# Patient Record
Sex: Female | Born: 1990 | Race: Black or African American | Hispanic: No | Marital: Single
Health system: Southern US, Community
[De-identification: ages and names within clinical notes are randomized; demographics above are authoritative.]

---

## 2014-05-18 ENCOUNTER — Encounter (HOSPITAL_COMMUNITY): Payer: Self-pay | Admitting: *Deleted

## 2014-05-18 ENCOUNTER — Emergency Department (HOSPITAL_COMMUNITY): Payer: Medicaid Other

## 2014-05-18 ENCOUNTER — Emergency Department (HOSPITAL_COMMUNITY)
Admission: EM | Admit: 2014-05-18 | Discharge: 2014-05-18 | Disposition: A | Discharge status: Home / Self Care | Payer: Medicaid Other | Attending: Emergency Medicine | Admitting: Emergency Medicine

## 2014-05-18 DIAGNOSIS — R58 Hemorrhage, not elsewhere classified: Secondary | ICD-10-CM

## 2014-05-18 DIAGNOSIS — N390 Urinary tract infection, site not specified: Secondary | ICD-10-CM

## 2014-05-18 DIAGNOSIS — Z3A18 18 weeks gestation of pregnancy: Secondary | ICD-10-CM | POA: Diagnosis not present

## 2014-05-18 DIAGNOSIS — O2342 Unspecified infection of urinary tract in pregnancy, second trimester: Secondary | ICD-10-CM | POA: Insufficient documentation

## 2014-05-18 DIAGNOSIS — O200 Threatened abortion: Secondary | ICD-10-CM | POA: Diagnosis not present

## 2014-05-18 DIAGNOSIS — O209 Hemorrhage in early pregnancy, unspecified: Secondary | ICD-10-CM | POA: Diagnosis present

## 2014-05-18 LAB — CBC WITH DIFFERENTIAL/PLATELET
BASOS PCT: 0 % (ref 0–1)
Basophils Absolute: 0 10*3/uL (ref 0.0–0.1)
EOS ABS: 0.1 10*3/uL (ref 0.0–0.7)
Eosinophils Relative: 1 % (ref 0–5)
HCT: 28.8 % — ABNORMAL LOW (ref 36.0–46.0)
Hemoglobin: 9.9 g/dL — ABNORMAL LOW (ref 12.0–15.0)
Lymphocytes Relative: 22 % (ref 12–46)
Lymphs Abs: 2 10*3/uL (ref 0.7–4.0)
MCH: 33.1 pg (ref 26.0–34.0)
MCHC: 34.4 g/dL (ref 30.0–36.0)
MCV: 96.3 fL (ref 78.0–100.0)
Monocytes Absolute: 0.8 10*3/uL (ref 0.1–1.0)
Monocytes Relative: 8 % (ref 3–12)
NEUTROS ABS: 6.2 10*3/uL (ref 1.7–7.7)
Neutrophils Relative %: 69 % (ref 43–77)
PLATELETS: 224 10*3/uL (ref 150–400)
RBC: 2.99 MIL/uL — AB (ref 3.87–5.11)
RDW: 12.6 % (ref 11.5–15.5)
WBC: 9.1 10*3/uL (ref 4.0–10.5)

## 2014-05-18 LAB — URINALYSIS, ROUTINE W REFLEX MICROSCOPIC
BILIRUBIN URINE: NEGATIVE
GLUCOSE, UA: NEGATIVE mg/dL
HGB URINE DIPSTICK: NEGATIVE
Ketones, ur: 40 mg/dL — AB
Nitrite: POSITIVE — AB
Protein, ur: NEGATIVE mg/dL
SPECIFIC GRAVITY, URINE: 1.024 (ref 1.005–1.030)
Urobilinogen, UA: 0.2 mg/dL (ref 0.0–1.0)
pH: 6 (ref 5.0–8.0)

## 2014-05-18 LAB — COMPREHENSIVE METABOLIC PANEL
ALK PHOS: 41 U/L (ref 39–117)
ALT: 9 U/L (ref 0–35)
AST: 15 U/L (ref 0–37)
Albumin: 3.6 g/dL (ref 3.5–5.2)
Anion gap: 15 (ref 5–15)
BILIRUBIN TOTAL: 0.4 mg/dL (ref 0.3–1.2)
BUN: 9 mg/dL (ref 6–23)
CHLORIDE: 101 meq/L (ref 96–112)
CO2: 20 mEq/L (ref 19–32)
Calcium: 9.2 mg/dL (ref 8.4–10.5)
Creatinine, Ser: 0.42 mg/dL — ABNORMAL LOW (ref 0.50–1.10)
GFR calc Af Amer: >90 mL/min (ref >90)
GFR calc non Af Amer: >90 mL/min (ref >90)
Glucose, Bld: 73 mg/dL (ref 70–99)
POTASSIUM: 3.7 meq/L (ref 3.7–5.3)
SODIUM: 136 meq/L — AB (ref 137–147)
TOTAL PROTEIN: 7 g/dL (ref 6.0–8.3)

## 2014-05-18 LAB — WET PREP, GENITAL
Trich, Wet Prep: NONE SEEN
Yeast Wet Prep HPF POC: NONE SEEN

## 2014-05-18 LAB — URINE MICROSCOPIC-ADD ON

## 2014-05-18 LAB — HIV ANTIBODY (ROUTINE TESTING W REFLEX): HIV: NONREACTIVE

## 2014-05-18 LAB — POC URINE PREG, ED: Preg Test, Ur: POSITIVE — AB

## 2014-05-18 LAB — LIPASE, BLOOD: Lipase: 17 U/L (ref 11–59)

## 2014-05-18 LAB — ABO/RH: ABO/RH(D): O POS

## 2014-05-18 LAB — HCG, QUANTITATIVE, PREGNANCY: HCG, BETA CHAIN, QUANT, S: 21550 m[IU]/mL — AB (ref <5)

## 2014-05-18 MED ORDER — CEPHALEXIN 500 MG PO CAPS: 500.0000 mg | ORAL_CAPSULE | Freq: Four times a day (QID) | ORAL | Status: DC

## 2014-05-18 MED ORDER — PRENATAL COMPLETE 14-0.4 MG PO TABS: 1.0000 | ORAL_TABLET | Freq: Two times a day (BID) | ORAL | Status: AC

## 2014-05-18 MED ORDER — NITROFURANTOIN MONOHYD MACRO 100 MG PO CAPS: 100.0000 mg | ORAL_CAPSULE | Freq: Two times a day (BID) | ORAL | Status: AC

## 2014-05-18 MED ORDER — PRENATAL COMPLETE 14-0.4 MG PO TABS: 1.0000 | ORAL_TABLET | Freq: Two times a day (BID) | ORAL | Status: DC

## 2014-05-18 NOTE — ED Provider Notes (Signed)
Patient handed off from LakewoodAbigail Harris, PA-C  "Margaret Prince is a(n) 23 y.o. G1P0 female who presents to the ED for complaint of vaginal bleeding. Patient's LMP was 01/11/2014. She is here living with her Grandmother. The patient has had intermittent nausea and vomiting through her pregnancy. One week ago she had an episode of acute onset nausea and vomiting. Patient states that she also had some diarrhea when she went to wipe, she noticed some spotting from her vagina. Patient thought that she had a miscarriage. She states that she still however is having "symptoms of pregnancy." Which include enlarged. Abdomen, heavy breasts, nausea. Patient denies pain today or bleeding. She states she "wants to make sure that she states still pregnant and that everything seemed okay. Her grandmother. Attends her today. The patient denies urinary symptoms." Arthor Captain_[name], PA-C  Results for orders placed or performed during the hospital encounter of 05/18/14  Wet prep, genital  Result Value Ref Range   Yeast Wet Prep HPF POC NONE SEEN NONE SEEN   Trich, Wet Prep NONE SEEN NONE SEEN   Clue Cells Wet Prep HPF POC FEW (A) NONE SEEN   WBC, Wet Prep HPF POC FEW (A) NONE SEEN  CBC with Differential  Result Value Ref Range   WBC 9.1 4.0 - 10.5 K/uL   RBC 2.99 (L) 3.87 - 5.11 MIL/uL   Hemoglobin 9.9 (L) 12.0 - 15.0 g/dL   HCT 40.928.8 (L) 81.136.0 - 91.446.0 %   MCV 96.3 78.0 - 100.0 fL   MCH 33.1 26.0 - 34.0 pg   MCHC 34.4 30.0 - 36.0 g/dL   RDW 78.212.6 95.611.5 - 21.315.5 %   Platelets 224 150 - 400 K/uL   Neutrophils Relative % 69 43 - 77 %   Neutro Abs 6.2 1.7 - 7.7 K/uL   Lymphocytes Relative 22 12 - 46 %   Lymphs Abs 2.0 0.7 - 4.0 K/uL   Monocytes Relative 8 3 - 12 %   Monocytes Absolute 0.8 0.1 - 1.0 K/uL   Eosinophils Relative 1 0 - 5 %   Eosinophils Absolute 0.1 0.0 - 0.7 K/uL   Basophils Relative 0 0 - 1 %   Basophils Absolute 0.0 0.0 - 0.1 K/uL  Comprehensive metabolic panel  Result Value Ref Range   Sodium 136  (L) 137 - 147 mEq/L   Potassium 3.7 3.7 - 5.3 mEq/L   Chloride 101 96 - 112 mEq/L   CO2 20 19 - 32 mEq/L   Glucose, Bld 73 70 - 99 mg/dL   BUN 9 6 - 23 mg/dL   Creatinine, Ser 0.860.42 (L) 0.50 - 1.10 mg/dL   Calcium 9.2 8.4 - 57.810.5 mg/dL   Total Protein 7.0 6.0 - 8.3 g/dL   Albumin 3.6 3.5 - 5.2 g/dL   AST 15 0 - 37 U/L   ALT 9 0 - 35 U/L   Alkaline Phosphatase 41 39 - 117 U/L   Total Bilirubin 0.4 0.3 - 1.2 mg/dL   GFR calc non Af Amer >90 >90 mL/min   GFR calc Af Amer >90 >90 mL/min   Anion gap 15 5 - 15  Lipase, blood  Result Value Ref Range   Lipase 17 11 - 59 U/L  Urinalysis, Routine w reflex microscopic  Result Value Ref Range   Color, Urine YELLOW YELLOW   APPearance CLOUDY (A) CLEAR   Specific Gravity, Urine 1.024 1.005 - 1.030   pH 6.0 5.0 - 8.0   Glucose, UA NEGATIVE NEGATIVE  mg/dL   Hgb urine dipstick NEGATIVE NEGATIVE   Bilirubin Urine NEGATIVE NEGATIVE   Ketones, ur 40 (A) NEGATIVE mg/dL   Protein, ur NEGATIVE NEGATIVE mg/dL   Urobilinogen, UA 0.2 0.0 - 1.0 mg/dL   Nitrite POSITIVE (A) NEGATIVE   Leukocytes, UA MODERATE (A) NEGATIVE  hCG, quantitative, pregnancy  Result Value Ref Range   hCG, Beta Chain, Quant, S 21550 (H) <5 mIU/mL  Urine microscopic-add on  Result Value Ref Range   Squamous Epithelial / LPF FEW (A) RARE   WBC, UA 11-20 <3 WBC/hpf   Bacteria, UA MANY (A) RARE  POC urine preg, ED  Result Value Ref Range   Preg Test, Ur POSITIVE (A) NEGATIVE  ABO/Rh  Result Value Ref Range   ABO/RH(D) O POS    No rh immune globuloin NOT A RH IMMUNE GLOBULIN CANDIDATE, PT RH POSITIVE    [place] Ob Limited  05/18/2014   CLINICAL DATA:  Patient with history of vaginal bleeding.  EXAM: LIMITED OBSTETRIC ULTRASOUND  FINDINGS: Number of Fetuses: 1  Heart Rate:  145 bpm  Movement: Yes  Presentation: Cephalic  Placental Location: Posterior  Previa: No  Amniotic Fluid (Subjective):  Within normal limits.  BPD:  39.9cm 18w  1d  MATERNAL FINDINGS:  Cervix:  Appears closed.   Uterus/Adnexae:  No abnormality visualized.  IMPRESSION: Single live intrauterine gestation without definite evidence for acute complication on limited examination.  This exam is performed on an emergent basis and does not comprehensively evaluate fetal size, dating, or anatomy; follow-up complete OB US should be considered if further fetal assessment is warranted.   Electronically Signed   By: Annia Beltrew  Davis M.D.   On: 05/18/2014 16:55    4: 21 pm The patient is pregnant and has had bleeding. She is Rh+ and does not need RH immune globulin. Her urine preg is positive. She does not continue to bleed or have any pain today. She is currently awaiting to go OB US. She does have a UTI and will be given Keflex PO for this.  5:22 pm BPD: 39.9cm 18w 1day Patient has had no more bleeding in the ED either.  She has been diagnosed with threatened abortion and UTI.  Rx: Macrobid and Prenatal vitamins.[facility]. Patient made aware she is anemia and recommended she go to wom[facility] outpatient clinic if she has anymore complications. Or [facility]  23 y.o.Margaret Prince's evaluation in the Emergency Department is complete. It has been determined that no acute conditions requiring further emergency intervention are present at this time. The patient/guardian have been advised of the diagnosis and plan. We have discussed signs and symptoms that warrant return to the ED, such as changes or worsening in symptoms.  Vital signs are stable at discharge. Filed Vitals:   05/18/14 1500  BP: 111/64  Pulse: 95  Temp:   Resp:     Patient/guardian has voiced understanding and agreed to follow-up with the PCP or specialist.   [name] Taniqua Issa, PA-C 05/18/14 1723  [name] [name], PA-C 05/18/14 1737  [name] [name], PA-C 05/18/14 [id number]

## 2014-05-18 NOTE — ED Notes (Signed)
Pt in c/o episode of vaginal bleeding, LMP was 8/1 and patient has had a positive pregnancy test, on 12/2 patient experienced n/v/d, and during that had some scant bleeding that was noticed when wiping after the bathroom, no bleeding since that time, denies pain. Pt had an ultrasound confirming intrauterine pregnancy in October. Pt in today to make sure everything is ok, concerned she may have had a miscarriage when she was bleeding. No distress noted. Pt G1P0

## 2014-05-18 NOTE — ED Notes (Signed)
Pt reports that she is having a miscarriage, due to having n/v everytime she eats and had vaginal bleeding on 12/2. [place] and unsure of how far along she is but stats her due date was 5/7.

## 2014-05-18 NOTE — Discharge Instructions (Signed)
Urinary Tract Infection °Urinary tract infections (UTIs) can develop anywhere along your urinary tract. Your urinary tract is your body's drainage system for removing wastes and extra water. Your urinary tract includes two kidneys, two ureters, a bladder, and a urethra. Your kidneys are a pair of bean-shaped organs. Each kidney is about the size of your fist. They are located below your ribs, one on each side of your spine. °CAUSES °Infections are caused by microbes, which are microscopic organisms, including fungi, viruses, and bacteria. These organisms are so small that they can only be seen through a microscope. Bacteria are the microbes that most commonly cause UTIs. °SYMPTOMS  °Symptoms of UTIs may vary by age and gender of the patient and by the location of the infection. Symptoms in young women typically include a frequent and intense urge to urinate and a painful, burning feeling in the bladder or urethra during urination. Older women and men are more likely to be tired, shaky, and weak and have muscle aches and abdominal pain. A fever may mean the infection is in your kidneys. Other symptoms of a kidney infection include pain in your back or sides below the ribs, nausea, and vomiting. °DIAGNOSIS °To diagnose a UTI, your caregiver will ask you about your symptoms. Your caregiver also will ask to provide a urine sample. The urine sample will be tested for bacteria and white blood cells. White blood cells are made by your body to help fight infection. °TREATMENT  °Typically, UTIs can be treated with medication. Because most UTIs are caused by a bacterial infection, they usually can be treated with the use of antibiotics. The choice of antibiotic and length of treatment depend on your symptoms and the type of bacteria causing your infection. °HOME CARE INSTRUCTIONS °· If you were prescribed antibiotics, take them exactly as your caregiver instructs you. Finish the medication even if you feel better after you  have only taken some of the medication. °· Drink enough water and fluids to keep your urine clear or pale yellow. °· Avoid caffeine, tea, and carbonated beverages. They tend to irritate your bladder. °· Empty your bladder often. Avoid holding urine for long periods of time. °· Empty your bladder before and after sexual intercourse. °· After a bowel movement, women should cleanse from front to back. Use each tissue only once. °SEEK MEDICAL CARE IF:  °· You have back pain. °· You develop a fever. °· Your symptoms do not begin to resolve within 3 days. °SEEK IMMEDIATE MEDICAL CARE IF:  °· You have severe back pain or lower abdominal pain. °· You develop chills. °· You have nausea or vomiting. °· You have continued burning or discomfort with urination. °MAKE SURE YOU:  °· Understand these instructions. °· Will watch your condition. °· Will get help right away if you are not doing well or get worse. °Document Released: 03/09/2005 Document Revised: 11/29/2011 Document Reviewed: 07/08/2011 °ExitCare® Patient Information ©2015 [employer]. This information is not intended to replace advice given to you by your health care provider. Make sure you discuss any questions you have with your health care provider. °Threatened Miscarriage °A threatened miscarriage occurs when you have vaginal bleeding during your first 20 weeks of pregnancy but the pregnancy has not ended. If you have vaginal bleeding during this time, your health care provider will do tests to make sure you are still pregnant. If the tests show you are still pregnant and the developing baby (fetus) inside your womb (uterus) is still growing, your   condition is considered a threatened miscarriage. °A threatened miscarriage does not mean your pregnancy will end, but it does increase the risk of losing your pregnancy (complete miscarriage). °CAUSES  °The cause of a threatened miscarriage is usually not known. If you go on to have a complete miscarriage, the most  common cause is an abnormal number of chromosomes in the developing baby. Chromosomes are the structures inside cells that hold all your genetic material. °Some causes of vaginal bleeding that do not result in miscarriage include: °· Having sex. °· Having an infection. °· Normal hormone changes of pregnancy. °· Bleeding that occurs when an egg implants in your uterus. °RISK FACTORS °Risk factors for bleeding in early pregnancy include: °· Obesity. °· Smoking. °· Drinking excessive amounts of alcohol or caffeine. °· Recreational drug use. °SIGNS AND SYMPTOMS °· Light vaginal bleeding. °· Mild abdominal pain or cramps. °DIAGNOSIS  °If you have bleeding with or without abdominal pain before 20 weeks of pregnancy, your health care provider will do tests to check whether you are still pregnant. One important test involves using sound waves and a computer (ultrasound) to create images of the inside of your uterus. Other tests include an internal exam of your vagina and uterus (pelvic exam) and measurement of your baby's heart rate.  °You may be diagnosed with a threatened miscarriage if: °· Ultrasound testing shows you are still pregnant. °· Your baby's heart rate is strong. °· A pelvic exam shows that the opening between your uterus and your vagina (cervix) is closed. °· Your heart rate and blood pressure are stable. °· Blood tests confirm you are still pregnant. °TREATMENT  °No treatments have been shown to prevent a threatened miscarriage from going on to a complete miscarriage. However, the right home care is important.  °HOME CARE INSTRUCTIONS  °· Make sure you keep all your appointments for prenatal care. This is very important. °· Get plenty of rest. °· Do not have sex or use tampons if you have vaginal bleeding. °· Do not douche. °· Do not smoke or use recreational drugs. °· Do not drink alcohol. °· Avoid caffeine. °SEEK MEDICAL CARE IF: °· You have light vaginal bleeding or spotting while pregnant. °· You have  abdominal pain or cramping. °· You have a fever. °SEEK IMMEDIATE MEDICAL CARE IF: °· You have heavy vaginal bleeding. °· You have blood clots coming from your vagina. °· You have severe low back pain or abdominal cramps. °· You have fever, chills, and severe abdominal pain. °MAKE SURE YOU: °· Understand these instructions. °· Will watch your condition. °· Will get help right away if you are not doing well or get worse. °Document Released: 05/30/2005 Document Revised: 06/04/2013 Document Reviewed: 03/26/2013 °ExitCare® Patient Information ©2015 [employer]. This information is not intended to replace advice given to you by your health care provider. Make sure you discuss any questions you have with your health care provider. ° °

## 2014-05-18 NOTE — ED Provider Notes (Signed)
CSN: [id number]     Arrival date & time 05/18/14  1223 History   First MD Initiated Contact with Patient 05/18/14 1402     Chief Complaint  Patient presents with  . Vaginal Bleeding  . Emesis     (Consider location/radiation/quality/duration/timing/severity/associated sxs/prior Treatment) HPI   Margaret Prince is a(n) 23 y.o. G1P0 female who presents to the ED for complaint of vaginal bleeding. Patient's LMP was 01/11/2014. She is here living with her Grandmother. The patient has had intermittent nausea and vomiting through her pregnancy. One week ago she had an episode of acute onset nausea and vomiting. Patient states that she also had some diarrhea when she went to wipe, she noticed some spotting from her vagina. Patient thought that she had a miscarriage. She states that she still however is having "symptoms of pregnancy." Which include enlarged. Abdomen, heavy breasts, nausea. Patient denies pain today or bleeding. She states she "wants to make sure that she states still pregnant and that everything seemed okay. Her grandmother. Attends her today. The patient denies urinary symptoms. History reviewed. No pertinent past medical history. History reviewed. No pertinent past surgical history. History reviewed. No pertinent family history. History  Substance Use Topics  . Smoking status: Not on file  . Smokeless tobacco: Not on file  . Alcohol Use: Yes     Comment: occ   OB History    Gravida Para Term Preterm AB TAB SAB Ectopic Multiple Living   1              Review of Systems  Ten systems reviewed and are negative for acute change, except as noted in the HPI.    Allergies  Review of patient's allergies indicates no known allergies.  Home Medications   Prior to Admission medications   Not on File   BP 111/64 mmHg  Pulse 95  Temp(Src) 98.3 F (36.8 C)  Resp 14  Ht 5' (1.524 m)  SpO2 100% Physical Exam  Constitutional: She is oriented to person, place, and time. She  appears well-developed and well-nourished. No distress.  HENT:  Head: Normocephalic and atraumatic.  Eyes: Conjunctivae are normal. No scleral icterus.  Neck: Normal range of motion.  Cardiovascular: Normal rate, regular rhythm and normal heart sounds.  Exam reveals no gallop and no friction rub.   No murmur heard. Pulmonary/Chest: Effort normal and breath sounds normal. No respiratory distress.  Abdominal: Soft. Bowel sounds are normal. She exhibits no distension and no mass. There is no tenderness. There is no guarding.  Genitourinary:  Pelvic exam: VULVA: normal appearing vulva with no masses, tenderness or lesions, VAGINA: normal appearing vagina with normal color and discharge, no lesions, CERVIX: normal appearing cervix without discharge or lesions, nulliparous os, UTERUS: enlarged to 3-4 cm below navel, ADNEXA: normal adnexa in size, nontender and no masses, exam chaperoned .   Neurological: She is alert and oriented to person, place, and time.  Skin: Skin is warm and dry. She is not diaphoretic.  Nursing note and vitals reviewed.   ED Course  Procedures (including critical care time) Labs Review Labs Reviewed  CBC WITH DIFFERENTIAL - Abnormal; Notable for the following:    RBC 2.99 (*)    Hemoglobin 9.9 (*)    HCT 28.8 (*)    All other components within normal limits  COMPREHENSIVE METABOLIC PANEL - Abnormal; Notable for the following:    Sodium 136 (*)    Creatinine, Ser 0.42 (*)    All other components  within normal limits  URINALYSIS, ROUTINE W REFLEX MICROSCOPIC - Abnormal; Notable for the following:    APPearance CLOUDY (*)    Ketones, ur 40 (*)    Nitrite POSITIVE (*)    Leukocytes, UA MODERATE (*)    All other components within normal limits  URINE MICROSCOPIC-ADD ON - Abnormal; Notable for the following:    Squamous Epithelial / LPF FEW (*)    Bacteria, UA MANY (*)    All other components within normal limits  POC URINE PREG, ED - Abnormal; Notable for the  following:    Preg Test, Ur POSITIVE (*)    All other components within normal limits  GC/CHLAMYDIA PROBE AMP  WET PREP, GENITAL  LIPASE, BLOOD  HIV ANTIBODY (ROUTINE TESTING)  RPR  HCG, QUANTITATIVE, PREGNANCY  ABO/RH    Imaging Review No results found.   EKG Interpretation None      MDM   Final diagnoses:  Bleeding    Patient with  Gravid uterus, + pregnancy, bleeding last week. + UTI. Normal pregnant pelvic.  HDS, NAD.  Awaiting OB US.  I have given handoff to  PA Neva SeatGreene who will assume care of the patient. Pt stable in ED with no significant deterioration in condition.     Arthor CaptainAbigail Tocara Mennen, PA-C 05/19/14 [id number]  Flint MelterElliott L Wentz, MD 05/20/14 [phone]

## 2014-05-19 LAB — RPR

## 2014-05-19 LAB — GC/CHLAMYDIA PROBE AMP
CT PROBE, AMP APTIMA: NEGATIVE
GC Probe RNA: NEGATIVE

## 2014-05-20 LAB — URINE CULTURE: Colony Count: >=100000

## 2014-05-21 ENCOUNTER — Telehealth (HOSPITAL_COMMUNITY): Payer: Self-pay

## 2014-05-21 NOTE — Telephone Encounter (Signed)
Post ED Visit - Positive Culture Follow-up  Culture report reviewed by antimicrobial stewardship pharmacist: []  Wes Dulaney, Pharm.D., BCPS [x]  Celedonio MiyamotoJeremy Frens, Pharm.D., BCPS []  Georgina PillionElizabeth Martin, [address] BCPS []  KurtenMinh Pham, [place]D., BCPS, AAHIVP []  Estella HuskMichelle Turner, Pharm.D., BCPS, AAHIVP []  Babs BertinHaley Baird, [address]   Positive Urine culture, >/= 100,000 colonies -> Klebsiella Pneumoniae Treated with Cephalexin, organism sensitive to the same and no further patient follow-up is required at this time.  Arvid RightClark, Meigan Pates Dorn 05/21/2014, 4:18 PM

## 2014-06-09 ENCOUNTER — Emergency Department (HOSPITAL_COMMUNITY)
Admission: EM | Admit: 2014-06-09 | Discharge: 2014-06-09 | Disposition: A | Discharge status: Home / Self Care | Payer: Medicaid - Out of State | Attending: Emergency Medicine | Admitting: Emergency Medicine

## 2014-06-09 ENCOUNTER — Emergency Department (HOSPITAL_COMMUNITY): Payer: Medicaid - Out of State

## 2014-06-09 ENCOUNTER — Encounter (HOSPITAL_COMMUNITY): Payer: Self-pay | Admitting: Emergency Medicine

## 2014-06-09 DIAGNOSIS — Z79899 Other long term (current) drug therapy: Secondary | ICD-10-CM | POA: Diagnosis not present

## 2014-06-09 DIAGNOSIS — O2342 Unspecified infection of urinary tract in pregnancy, second trimester: Secondary | ICD-10-CM | POA: Diagnosis not present

## 2014-06-09 DIAGNOSIS — O9989 Other specified diseases and conditions complicating pregnancy, childbirth and the puerperium: Secondary | ICD-10-CM | POA: Insufficient documentation

## 2014-06-09 DIAGNOSIS — R1084 Generalized abdominal pain: Secondary | ICD-10-CM | POA: Insufficient documentation

## 2014-06-09 DIAGNOSIS — O23592 Infection of other part of genital tract in pregnancy, second trimester: Secondary | ICD-10-CM | POA: Diagnosis not present

## 2014-06-09 DIAGNOSIS — Z3A21 21 weeks gestation of pregnancy: Secondary | ICD-10-CM | POA: Insufficient documentation

## 2014-06-09 DIAGNOSIS — N760 Acute vaginitis: Secondary | ICD-10-CM

## 2014-06-09 DIAGNOSIS — R109 Unspecified abdominal pain: Secondary | ICD-10-CM

## 2014-06-09 DIAGNOSIS — B9689 Other specified bacterial agents as the cause of diseases classified elsewhere: Secondary | ICD-10-CM

## 2014-06-09 DIAGNOSIS — N939 Abnormal uterine and vaginal bleeding, unspecified: Secondary | ICD-10-CM

## 2014-06-09 DIAGNOSIS — O26899 Other specified pregnancy related conditions, unspecified trimester: Secondary | ICD-10-CM

## 2014-06-09 DIAGNOSIS — N390 Urinary tract infection, site not specified: Secondary | ICD-10-CM

## 2014-06-09 LAB — URINALYSIS, ROUTINE W REFLEX MICROSCOPIC
BILIRUBIN URINE: NEGATIVE
Glucose, UA: NEGATIVE mg/dL
Ketones, ur: NEGATIVE mg/dL
Nitrite: POSITIVE — AB
PROTEIN: NEGATIVE mg/dL
Specific Gravity, Urine: 1.014 (ref 1.005–1.030)
UROBILINOGEN UA: 1 mg/dL (ref 0.0–1.0)
pH: 6.5 (ref 5.0–8.0)

## 2014-06-09 LAB — CBC WITH DIFFERENTIAL/PLATELET
BASOS PCT: 0 % (ref 0–1)
Basophils Absolute: 0 10*3/uL (ref 0.0–0.1)
EOS ABS: 0.1 10*3/uL (ref 0.0–0.7)
Eosinophils Relative: 1 % (ref 0–5)
HEMATOCRIT: 27.9 % — AB (ref 36.0–46.0)
HEMOGLOBIN: 9.6 g/dL — AB (ref 12.0–15.0)
Lymphocytes Relative: 14 % (ref 12–46)
Lymphs Abs: 1.5 10*3/uL (ref 0.7–4.0)
MCH: 33.6 pg (ref 26.0–34.0)
MCHC: 34.4 g/dL (ref 30.0–36.0)
MCV: 97.6 fL (ref 78.0–100.0)
MONO ABS: 0.8 10*3/uL (ref 0.1–1.0)
MONOS PCT: 7 % (ref 3–12)
Neutro Abs: 8.6 10*3/uL — ABNORMAL HIGH (ref 1.7–7.7)
Neutrophils Relative %: 78 % — ABNORMAL HIGH (ref 43–77)
Platelets: 257 10*3/uL (ref 150–400)
RBC: 2.86 MIL/uL — ABNORMAL LOW (ref 3.87–5.11)
RDW: 12.3 % (ref 11.5–15.5)
WBC: 11 10*3/uL — ABNORMAL HIGH (ref 4.0–10.5)

## 2014-06-09 LAB — COMPREHENSIVE METABOLIC PANEL
ALT: 10 U/L (ref 0–35)
AST: 15 U/L (ref 0–37)
Albumin: 3 g/dL — ABNORMAL LOW (ref 3.5–5.2)
Alkaline Phosphatase: 53 U/L (ref 39–117)
Anion gap: 6 (ref 5–15)
BUN: 7 mg/dL (ref 6–23)
CO2: 25 mmol/L (ref 19–32)
Calcium: 9 mg/dL (ref 8.4–10.5)
Chloride: 105 mEq/L (ref 96–112)
Creatinine, Ser: 0.45 mg/dL — ABNORMAL LOW (ref 0.50–1.10)
GFR calc non Af Amer: >90 mL/min (ref >90)
GLUCOSE: 88 mg/dL (ref 70–99)
Potassium: 3.5 mmol/L (ref 3.5–5.1)
SODIUM: 136 mmol/L (ref 135–145)
TOTAL PROTEIN: 6.5 g/dL (ref 6.0–8.3)
Total Bilirubin: 0.5 mg/dL (ref 0.3–1.2)

## 2014-06-09 LAB — URINE MICROSCOPIC-ADD ON

## 2014-06-09 LAB — WET PREP, GENITAL
Trich, Wet Prep: NONE SEEN
YEAST WET PREP: NONE SEEN

## 2014-06-09 MED ORDER — METRONIDAZOLE 500 MG PO TABS: 500.0000 mg | ORAL_TABLET | Freq: Two times a day (BID) | ORAL | Status: AC

## 2014-06-09 MED ORDER — NITROFURANTOIN MONOHYD MACRO 100 MG PO CAPS
100.0000 mg | ORAL_CAPSULE | Freq: Once | ORAL | Status: AC
  Administered 2014-06-09: 100 mg via ORAL
  Filled 2014-06-09: qty 1

## 2014-06-09 MED ORDER — METRONIDAZOLE 500 MG PO TABS
500.0000 mg | ORAL_TABLET | Freq: Once | ORAL | Status: AC
  Administered 2014-06-09: 500 mg via ORAL
  Filled 2014-06-09: qty 1

## 2014-06-09 MED ORDER — NITROFURANTOIN MONOHYD MACRO 100 MG PO CAPS: 100.0000 mg | ORAL_CAPSULE | Freq: Two times a day (BID) | ORAL | Status: AC

## 2014-06-09 MED ORDER — SODIUM CHLORIDE 0.9 % IV BOLUS (SEPSIS)
1000.0000 mL | Freq: Once | INTRAVENOUS | Status: AC
  Administered 2014-06-09: 1000 mL via INTRAVENOUS

## 2014-06-09 NOTE — ED Provider Notes (Signed)
CSN: [id number]     Arrival date & time 06/09/14  1228 History   First MD Initiated Contact with Patient 06/09/14 1541     Chief Complaint  Patient presents with  . Abdominal Pain     (Consider location/radiation/quality/duration/timing/severity/associated sxs/prior Treatment) Patient is a 22 y.o. female presenting with abdominal pain and female genitourinary complaint. The history is provided by the patient and a parent.  Abdominal Pain Pain location:  Generalized Pain quality: cramping   Pain severity:  Moderate Onset quality:  Gradual Duration:  2 days Timing:  Constant Chronicity:  Recurrent Context: not trauma   Context comment:  Pregnant Relieved by:  None tried Worsened by:  Nothing tried Ineffective treatments:  None tried Associated symptoms: vaginal bleeding   Associated symptoms: no chest pain, no chills, no constipation, no cough, no dysuria, no fatigue, no fever, no nausea, no shortness of breath, no sore throat, no vaginal discharge and no vomiting   Female GU Problem This is a recurrent (vaginal spotting) problem. The current episode started yesterday. The problem occurs constantly. The problem has been resolved. Associated symptoms include abdominal pain. Pertinent negatives include no arthralgias, chest pain, chills, coughing, diaphoresis, fatigue, fever, headaches, myalgias, nausea, rash, sore throat, vomiting or weakness. Nothing aggravates the symptoms. She has tried nothing for the symptoms. The treatment provided no relief.    History reviewed. No pertinent past medical history. History reviewed. No pertinent past surgical history. History reviewed. No pertinent family history. History  Substance Use Topics  . Smoking status: Not on file  . Smokeless tobacco: Not on file  . Alcohol Use: Yes     Comment: occ   OB History    Gravida Para Term Preterm AB TAB SAB Ectopic Multiple Living   1              Review of Systems  Constitutional: Negative for  fever, chills, diaphoresis, activity change, appetite change and fatigue.  HENT: Negative for facial swelling, rhinorrhea, sore throat, trouble swallowing and voice change.   Eyes: Negative for photophobia, pain and visual disturbance.  Respiratory: Negative for cough, shortness of breath, wheezing and stridor.   Cardiovascular: Negative for chest pain, palpitations and leg swelling.  Gastrointestinal: Positive for abdominal pain. Negative for nausea, vomiting, constipation and anal bleeding.  Endocrine: Negative.   Genitourinary: Positive for vaginal bleeding. Negative for dysuria, vaginal discharge and vaginal pain.  Musculoskeletal: Negative for myalgias, back pain and arthralgias.  Skin: Negative.  Negative for rash.  Allergic/Immunologic: Negative.   Neurological: Negative for dizziness, tremors, syncope, weakness and headaches.  Psychiatric/Behavioral: Negative for suicidal ideas, sleep disturbance and self-injury.  All other systems reviewed and are negative.     Allergies  Review of patient's allergies indicates no known allergies.  Home Medications   Prior to Admission medications   Medication Sig Start Date End Date Taking? Authorizing Provider  Prenatal Vit-Fe Fumarate-FA (PRENATAL COMPLETE) 14-0.4 MG TABS Take 1 tablet by mouth 2 (two) times daily. 05/18/14  Yes Dorthula Matas, PA-C  Prenatal Vit-Min-FA-Fish Oil ([facility] PRENATAL GUMMY PO) Take 1 tablet by mouth 2 (two) times daily.   Yes Historical Provider, MD  metroNIDAZOLE (FLAGYL) 500 MG tablet Take 1 tablet (500 mg total) by mouth 2 (two) times daily. 06/09/14   Lula Olszewski, MD  nitrofurantoin, macrocrystal-monohydrate, (MACROBID) 100 MG capsule Take 1 capsule (100 mg total) by mouth 2 (two) times daily. Patient not taking: Reported on 06/09/2014 05/18/14   Dorthula Matas, PA-C  nitrofurantoin, Gara Kroner, (  MACROBID) 100 MG capsule Take 1 capsule (100 mg total) by mouth 2 (two) times daily. 06/09/14   Lula OlszewskiMike  Paelyn Smick, MD   BP 104/66 mmHg  Pulse 88  Temp(Src) 98.9 F (37.2 C) (Oral)  Resp 22  SpO2 100%  LMP 01/11/2014 (Approximate) Physical Exam  Constitutional: She is oriented to person, place, and time. She appears well-developed and well-nourished. No distress.  HENT:  Head: Normocephalic and atraumatic.  Right Ear: External ear normal.  Left Ear: External ear normal.  Mouth/Throat: Oropharynx is clear and moist. No oropharyngeal exudate.  Eyes: Conjunctivae and EOM are normal. Pupils are equal, round, and reactive to light. No scleral icterus.  Neck: Normal range of motion. Neck supple. No JVD present. No tracheal deviation present. No thyromegaly present.  Cardiovascular: Normal rate, regular rhythm and intact distal pulses.  Exam reveals no gallop and no friction rub.   No murmur heard. Pulmonary/Chest: Effort normal and breath sounds normal. No respiratory distress. She has no wheezes. She has no rales.  Abdominal: Soft. Bowel sounds are normal. She exhibits distension (consistent with 21 week pregnancy). There is no tenderness.  Genitourinary: Pelvic exam was performed with patient supine. Cervix exhibits no motion tenderness, no discharge and no friability. Right adnexum displays no mass, no tenderness and no fullness. Left adnexum displays no mass, no tenderness and no fullness.  Musculoskeletal: Normal range of motion. She exhibits no edema or tenderness.  Neurological: She is alert and oriented to person, place, and time. No cranial nerve deficit. She exhibits normal muscle tone. Coordination normal.  Skin: Skin is warm and dry. She is not diaphoretic. No pallor.  Psychiatric: She has a normal mood and affect. She expresses no homicidal and no suicidal ideation. She expresses no suicidal plans and no homicidal plans.  Nursing note and vitals reviewed.   ED Course  Procedures (including critical care time) Labs Review Labs Reviewed  WET PREP, GENITAL - Abnormal; Notable for the  following:    Clue Cells Wet Prep HPF POC FEW (*)    WBC, Wet Prep HPF POC FEW (*)    All other components within normal limits  COMPREHENSIVE METABOLIC PANEL - Abnormal; Notable for the following:    Creatinine, Ser 0.45 (*)    Albumin 3.0 (*)    All other components within normal limits  CBC WITH DIFFERENTIAL - Abnormal; Notable for the following:    WBC 11.0 (*)    RBC 2.86 (*)    Hemoglobin 9.6 (*)    HCT 27.9 (*)    Neutrophils Relative % 78 (*)    Neutro Abs 8.6 (*)    All other components within normal limits  URINALYSIS, ROUTINE W REFLEX MICROSCOPIC - Abnormal; Notable for the following:    APPearance CLOUDY (*)    Hgb urine dipstick TRACE (*)    Nitrite POSITIVE (*)    Leukocytes, UA LARGE (*)    All other components within normal limits  URINE MICROSCOPIC-ADD ON - Abnormal; Notable for the following:    Bacteria, UA MANY (*)    All other components within normal limits  GC/CHLAMYDIA PROBE AMP  RPR  HIV ANTIBODY (ROUTINE TESTING)    Imaging Review [facility] Limited  06/09/2014   CLINICAL DATA:  Back pain.  Second trimester pregnancy.  EXAM: LIMITED OBSTETRIC ULTRASOUND  FINDINGS: Number of Fetuses: 1  Heart Rate:  130 bpm  Movement: Yes  Presentation: Cephalic  Placental Location: Fundal  Previa: No  Amniotic Fluid (Subjective):  Within normal  limits.  BPD:  5.0cm 21w  2d  MATERNAL FINDINGS:  Cervix:  Appears closed.  Uterus/Adnexae:  No abnormality visualized.  IMPRESSION: Single living intrauterine fetus in cephalic presentation. No acute maternal findings visualized.  This exam is performed on an emergent basis and does not comprehensively evaluate fetal size, dating, or anatomy; follow-up complete OB US should be considered if further fetal assessment is warranted.   Electronically Signed   By: Myles RosenthalJohn  Stahl M.D.   On: 06/09/2014 18:18     EKG Interpretation   Date/Time:  Monday June 09 2014 15:45:34 EST Ventricular Rate:  87 PR Interval:  153 QRS Duration: 99 QT  Interval:  354 QTC Calculation: 426 R Axis:   40 Text Interpretation:  Sinus rhythm RSR' in V1 or V2, right VCD or RVH No  previous ECGs available Confirmed by RANCOUR  MD, STEPHEN [phone]([id number]) on  06/09/2014 4:41:32 PM      MDM   Final diagnoses:  Vaginal bleeding  Abdominal pain in pregnancy  UTI (lower urinary tract infection)  BV (bacterial vaginosis)    The patient is a 23 y.o. G1P0 female at 1162w2d who presents with abdominal cramping and vaginal spotting that started yesterday. Patient AFVSS. Pelvic exam shows closed cervix, no bleeding, and is otherwise normal. Labs shows BV and UTI. Patient evaluated and cleared by OB rapid nurse, please see their note for more detail. Patient treated with macrobid and flagyl and discharged with OB followup and standard ED return precautions. Patient expresses understanding and agreement with this plan.   Patient seen with attending, Dr. Manus Gunningancour, who oversaw clinical decision making.     Lula OlszewskiMike Ahamed Hofland, MD 06/09/14 [id number]  Glynn OctaveStephen Rancour, MD 06/10/14 [phone]

## 2014-06-09 NOTE — ED Notes (Signed)
Pt sts [redacted] weeks pregnant with LMP 01/11/14; pt sts very limited prenatal care; pt sts cramping that is intermittent and some spotting this am; pt G1

## 2014-06-09 NOTE — ED Notes (Signed)
OB RR paged.

## 2014-06-09 NOTE — ED Notes (Signed)
OB rapid response nurse at bedside. 

## 2014-06-09 NOTE — Discharge Instructions (Signed)
Abdominal Pain During Pregnancy °Belly (abdominal) pain is common during pregnancy. Most of the time, it is not a serious problem. Other times, it can be a sign that something is wrong with the pregnancy. Always tell your doctor if you have belly pain. °HOME CARE °Monitor your belly pain for any changes. The following actions may help you feel better: °· Do not have sex (intercourse) or put anything in your vagina until you feel better. °· Rest until your pain stops. °· Drink clear fluids if you feel sick to your stomach (nauseous). Do not eat solid food until you feel better. °· Only take medicine as told by your doctor. °· Keep all doctor visits as told. °GET HELP RIGHT AWAY IF:  °· You are bleeding, leaking fluid, or pieces of tissue come out of your vagina. °· You have more pain or cramping. °· You keep throwing up (vomiting). °· You have pain when you pee (urinate) or have blood in your pee. °· You have a fever. °· You do not feel your baby moving as much. °· You feel very weak or feel like passing out. °· You have trouble breathing, with or without belly pain. °· You have a very bad headache and belly pain. °· You have fluid leaking from your vagina and belly pain. °· You keep having watery poop (diarrhea). °· Your belly pain does not go away after resting, or the pain gets worse. °MAKE SURE YOU:  °· Understand these instructions. °· Will watch your condition. °· Will get help right away if you are not doing well or get worse. °Document Released: 05/18/2009 Document Revised: 01/30/2013 Document Reviewed: 12/27/2012 °[employer]® Patient Information ©2015 [employer]. This information is not intended to replace advice given to you by your health care provider. Make sure you discuss any questions you have with your health care provider. ° °

## 2014-06-09 NOTE — Progress Notes (Signed)
Spoke with Dr. Adrian BlackwaterStinson. Pt is 21 2/[redacted] weeks pregnant with c/o cramping. FHR 157 BPM by doppler. UI on EFM. Cervix is closed. No bleeding noted by spec exam. Will give the pt the phone number to the [facility]

## 2014-06-09 NOTE — Progress Notes (Signed)
Dr. Festus AloeGoebel in to do pelvic exam. Cervix is closed. No bleeding noted by speculum exam.

## 2014-06-09 NOTE — Progress Notes (Signed)
Dr Rancour in to see pt 
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
## 2014-06-09 NOTE — ED Notes (Signed)
[place], and [place]. No obvoius injuries or deformities. Denies neck pain. Pt states her knees only hurt from the fall.

## 2014-06-09 NOTE — ED Notes (Signed)
Pt returned from US

## 2014-06-10 LAB — GC/CHLAMYDIA PROBE AMP
CT Probe RNA: NEGATIVE
GC Probe RNA: NEGATIVE

## 2014-06-10 LAB — HIV ANTIBODY (ROUTINE TESTING W REFLEX): HIV: NONREACTIVE

## 2014-06-10 LAB — RPR

## 2016-03-24 IMAGING — US US OB LIMITED
1 series · 14 of 28 positions shown · non-contrast
Comparison: none

CLINICAL DATA: Patient with history of vaginal bleeding.

EXAM:
LIMITED OBSTETRIC ULTRASOUND

[Series 1: us ob limited · 0.22mm/px · 34 acquisitions, 14 frames shown]
[im 2/34]
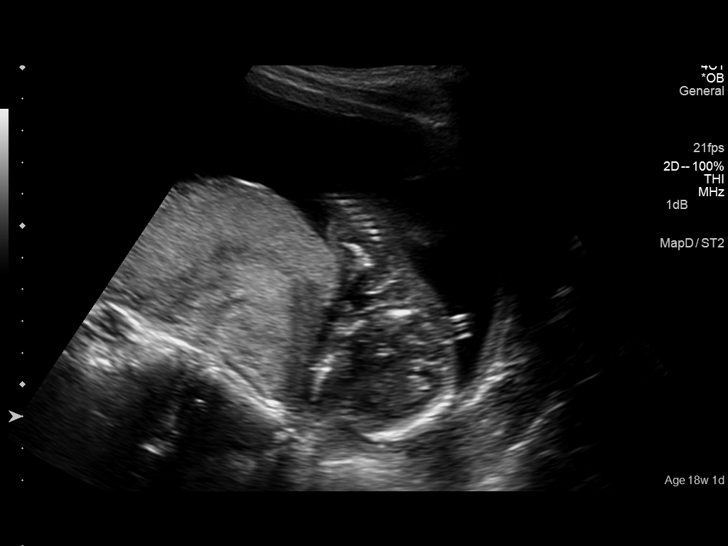
[im 4/34]
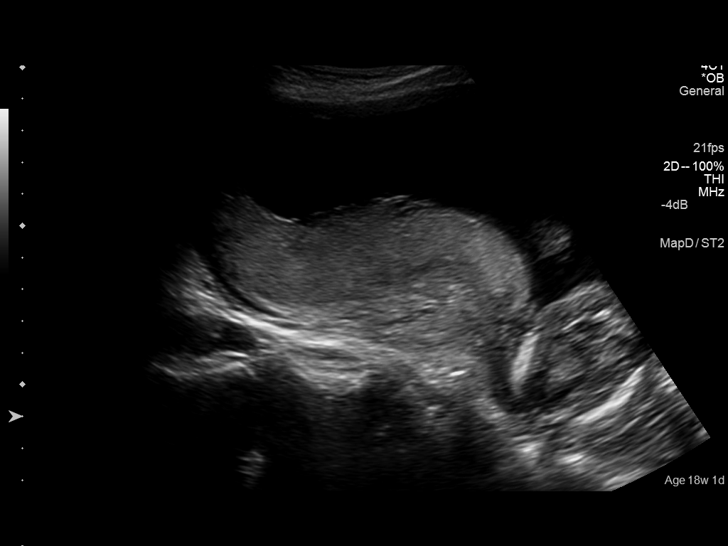
[im 7/34]
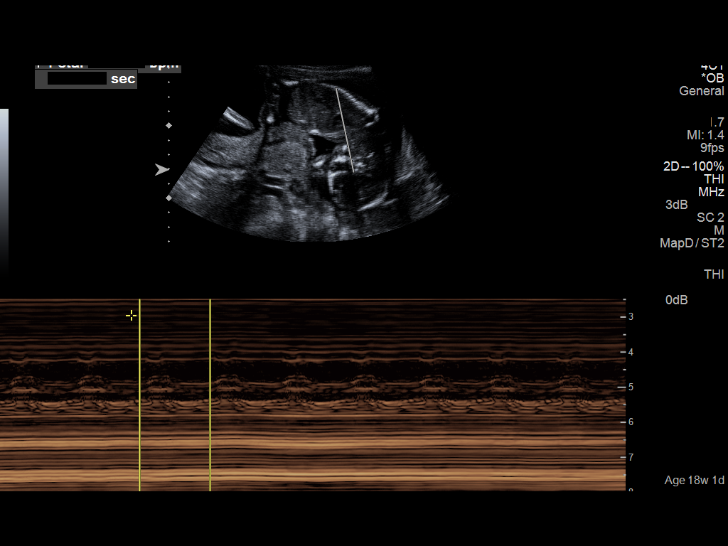
[im 9/34]
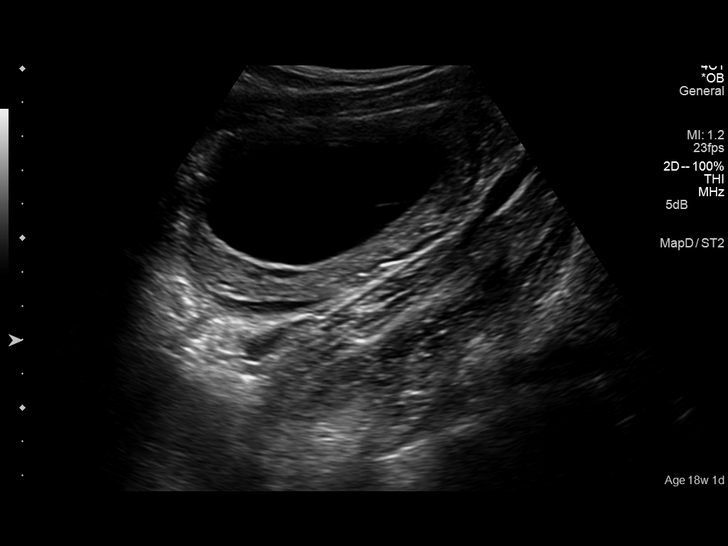
[im 12/34]
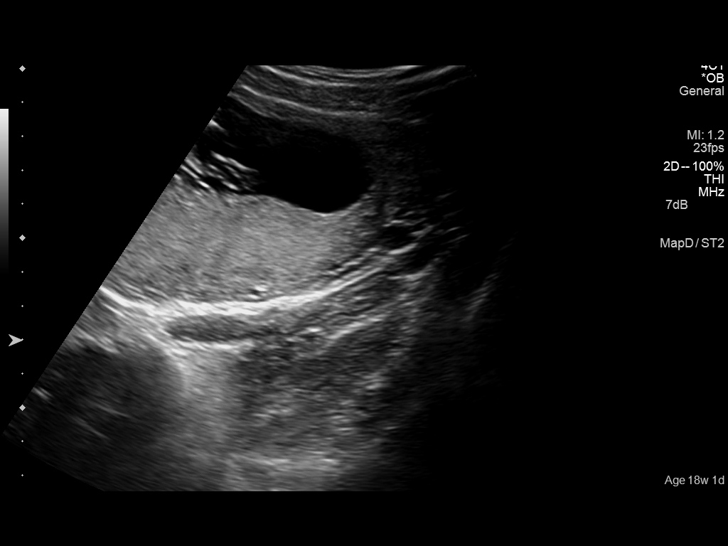
[im 14/34]
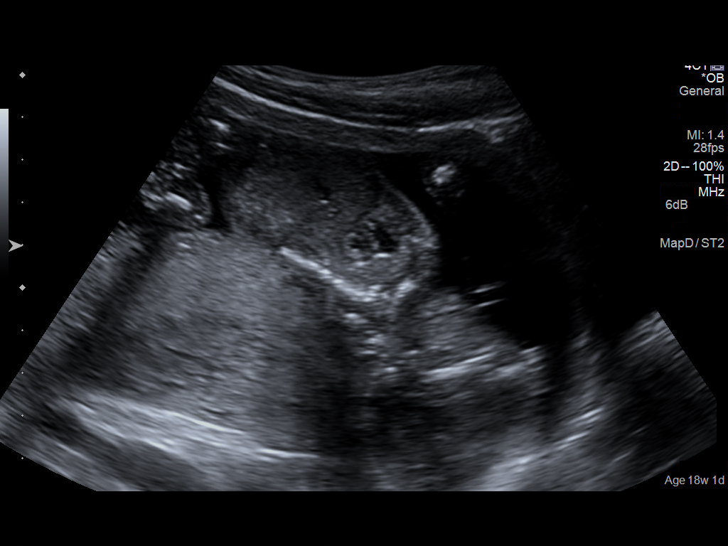
[im 16/34]
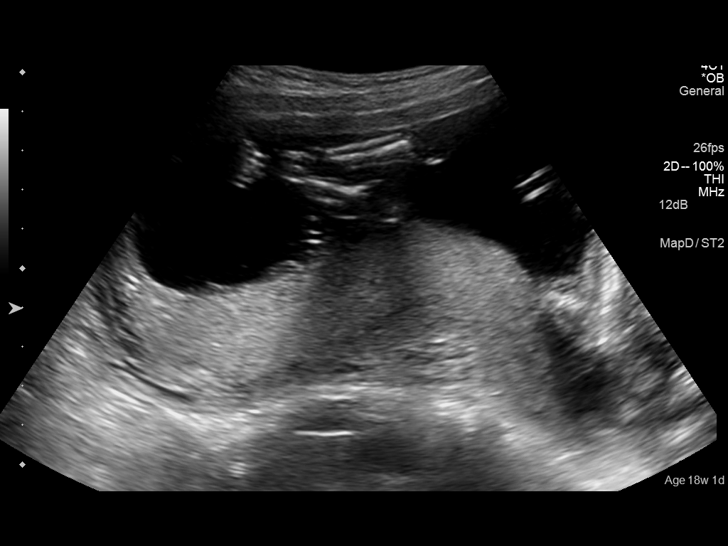
[im 19/34]
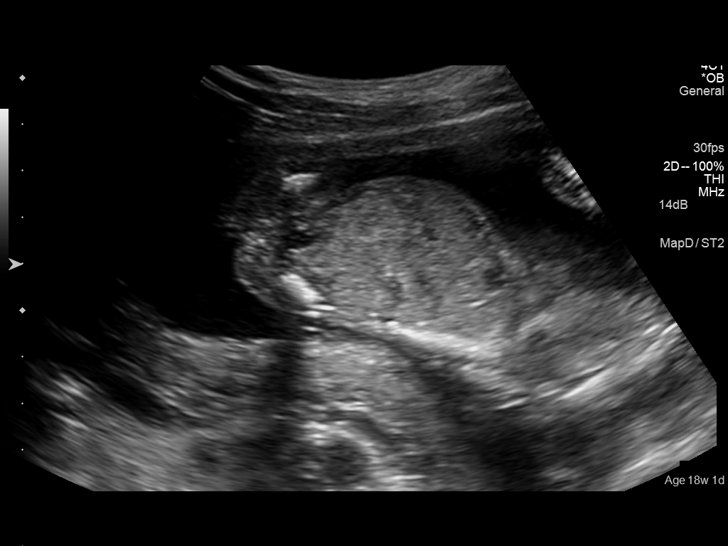
[im 21/34]
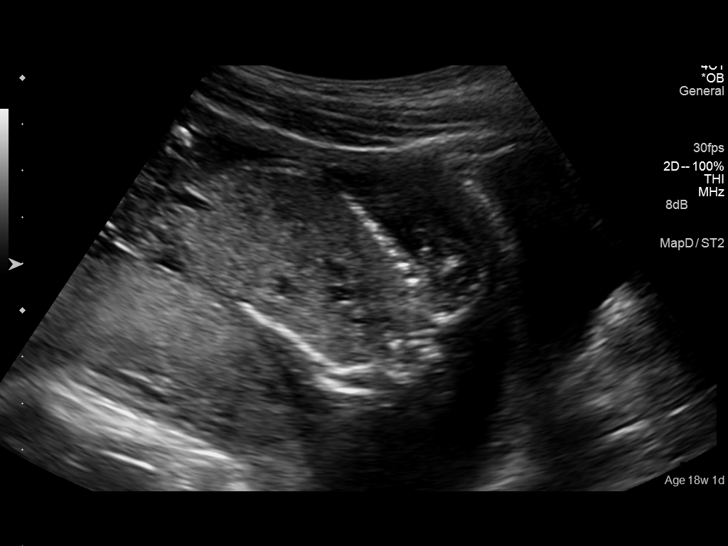
[im 24/34]
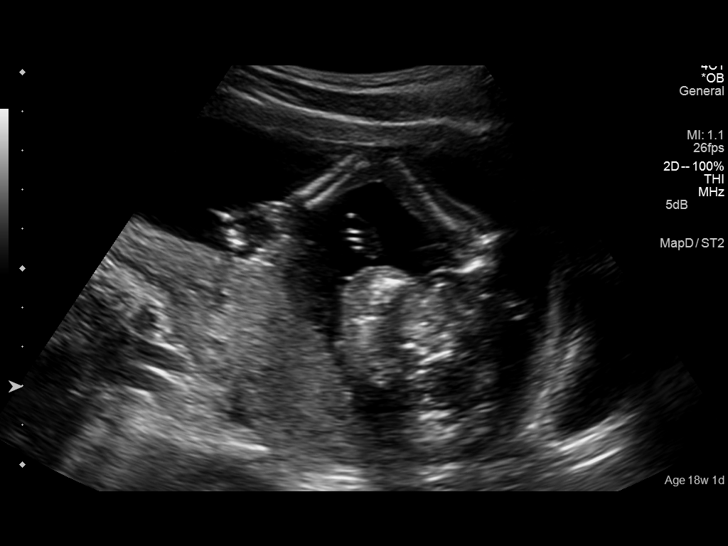
[im 26/34]
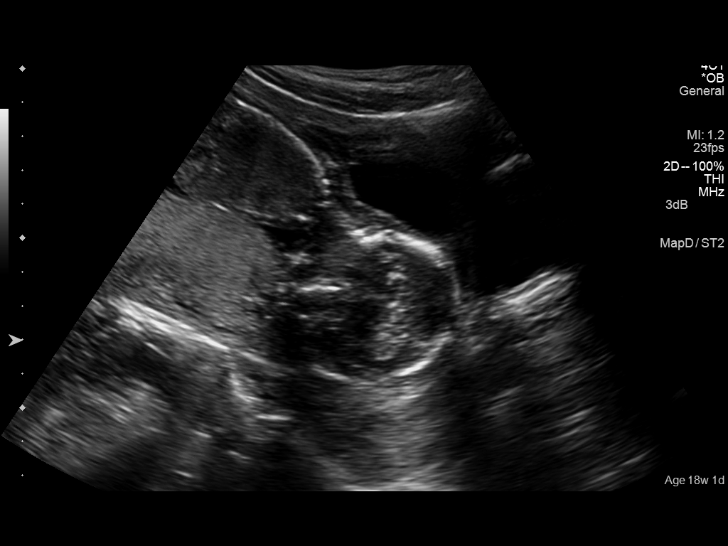
[im 29/34]
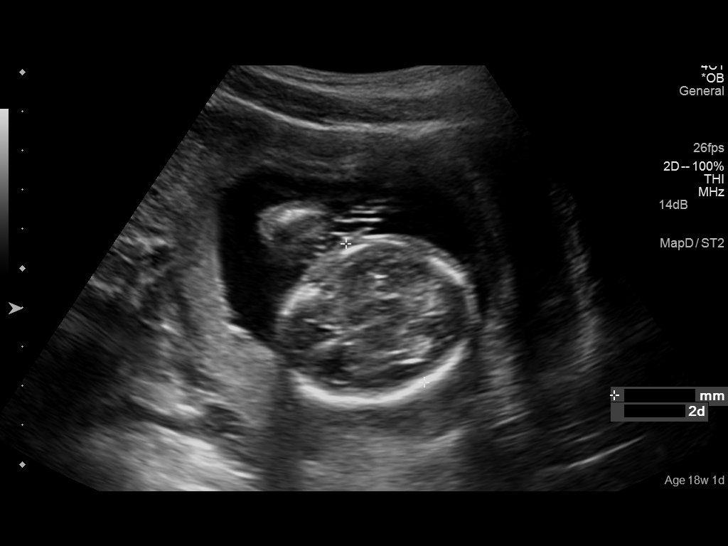
[im 31/34]
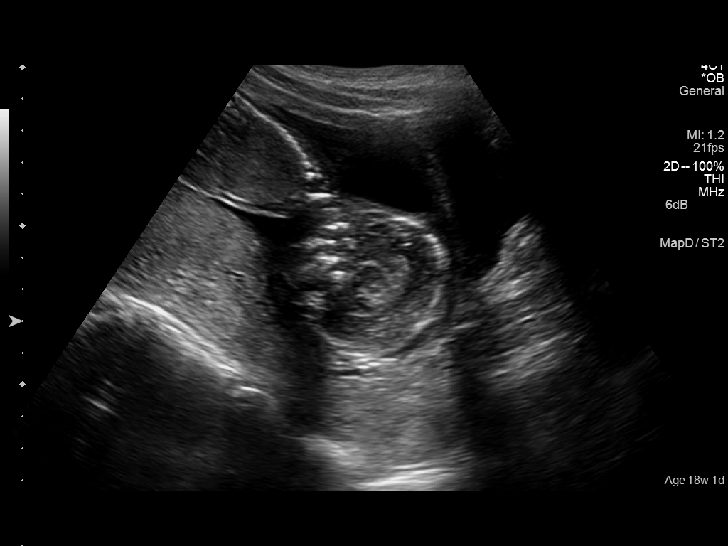
[im 34/34]
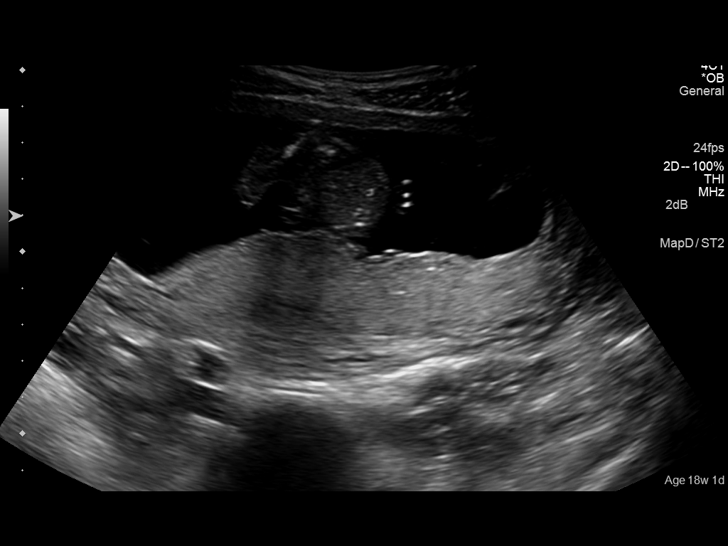

[14 of 28 positions shown; findings below may reference images not displayed]

FINDINGS: Number of Fetuses: 1

Heart Rate:  145 bpm

Movement: Yes

Presentation: Cephalic

Placental Location: Posterior

Previa: No

Amniotic Fluid (Subjective):  Within normal limits.

BPD:  39.9cm 18w  1d

MATERNAL FINDINGS:

Cervix:  Appears closed.

Uterus/Adnexae:  No abnormality visualized.
IMPRESSION: Single live intrauterine gestation without definite evidence for
acute complication on limited examination.

This exam is performed on an emergent basis and does not
comprehensively evaluate fetal size, dating, or anatomy; follow-up
complete OB US should be considered if further fetal assessment is
warranted.

## 2016-04-15 IMAGING — US US OB LIMITED
1 series · 14 of 28 positions shown · non-contrast
Comparison: none

CLINICAL DATA: Back pain.  Second trimester pregnancy.

EXAM:
LIMITED OBSTETRIC ULTRASOUND

[Series 1: us ob limited · 0.24mm/px · 14 of 29 slices shown]
[im 2/29]
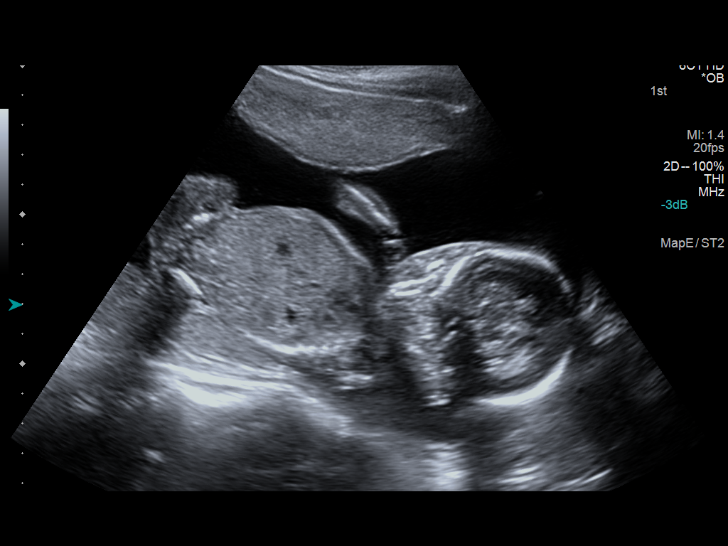
[im 4/29]
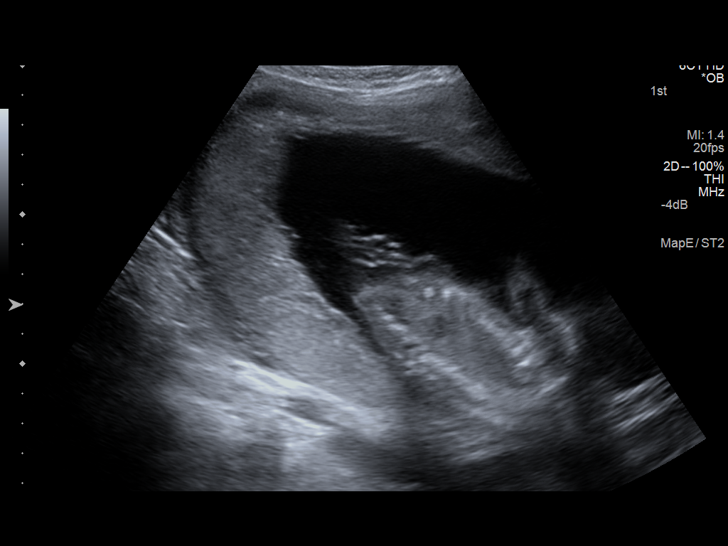
[im 6/29]
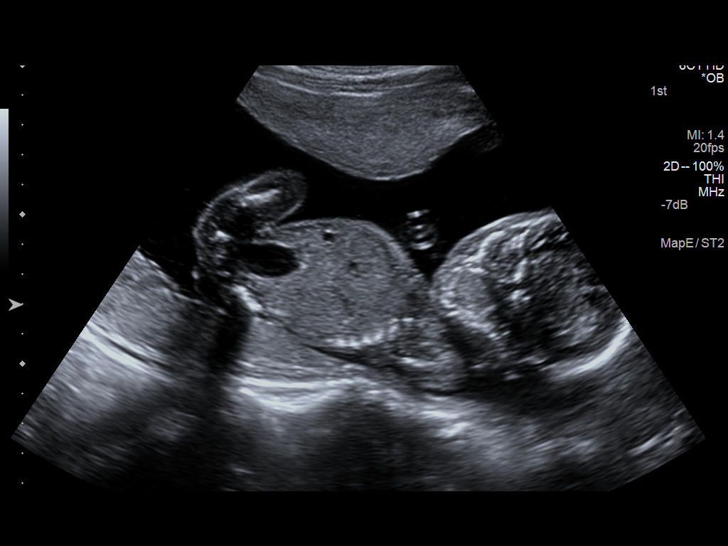
[im 8/29]
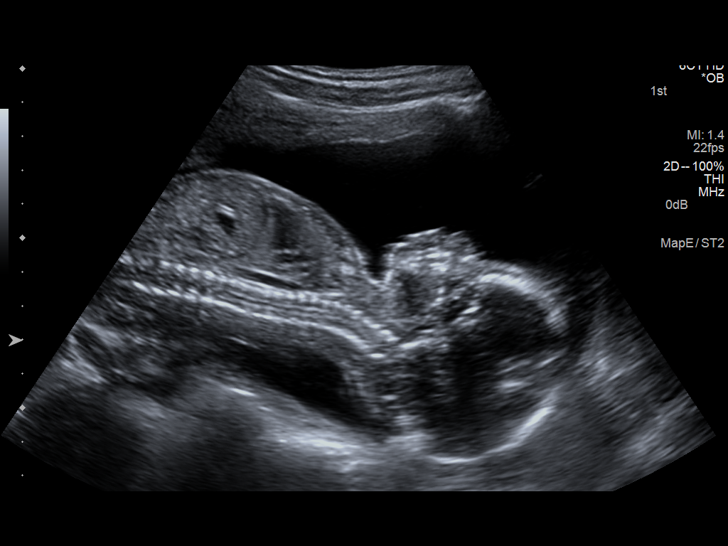
[im 10/29]
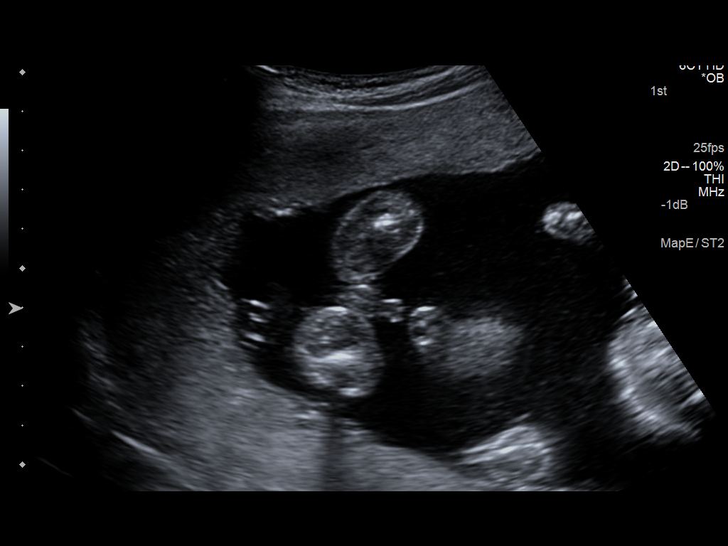
[im 12/29]
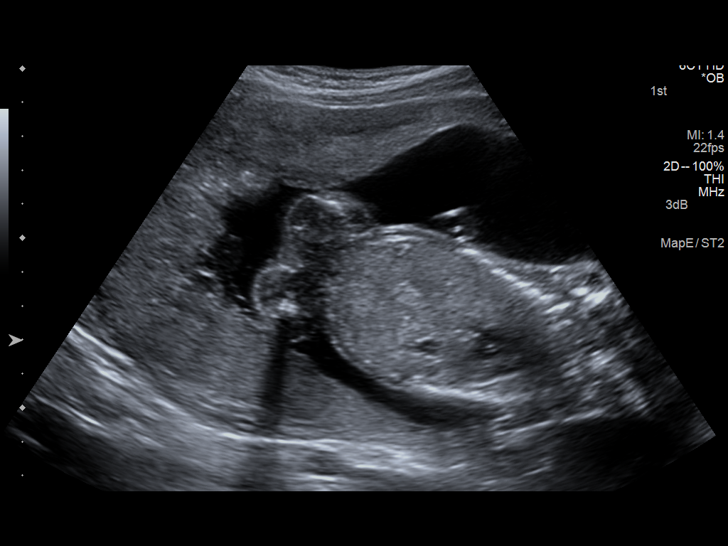
[im 14/29]
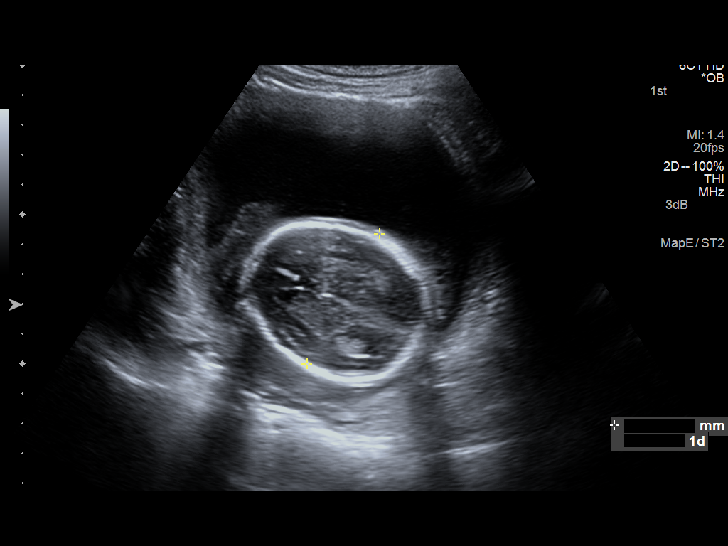
[im 16/29]
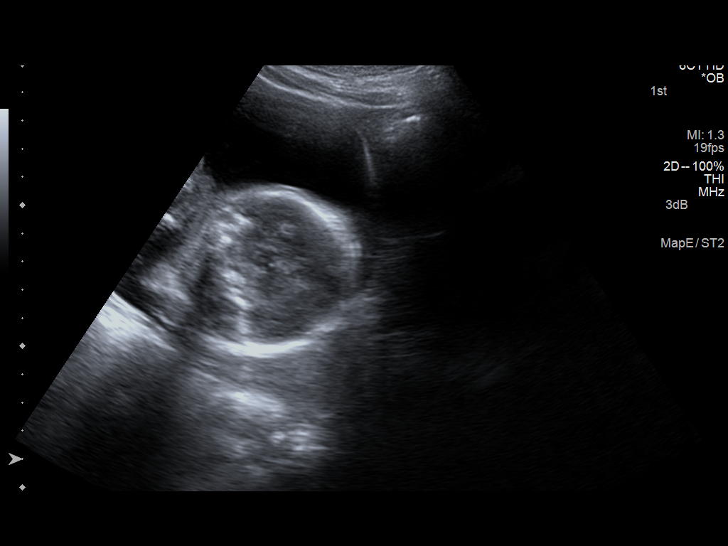
[im 18/29]
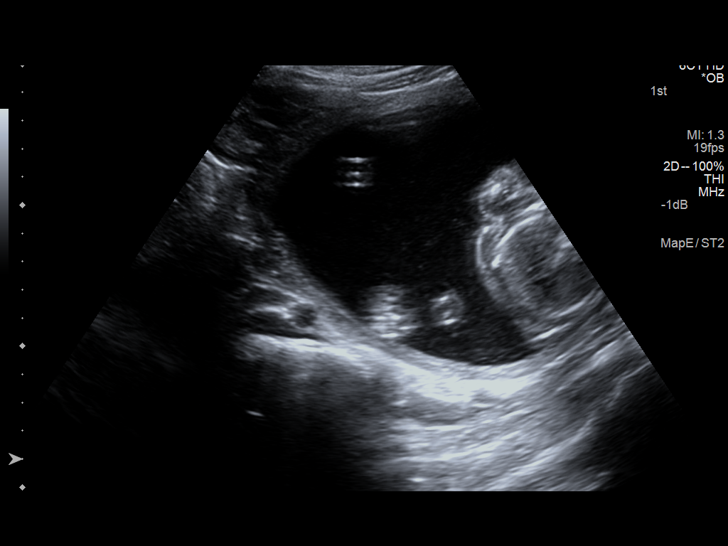
[im 20/29]
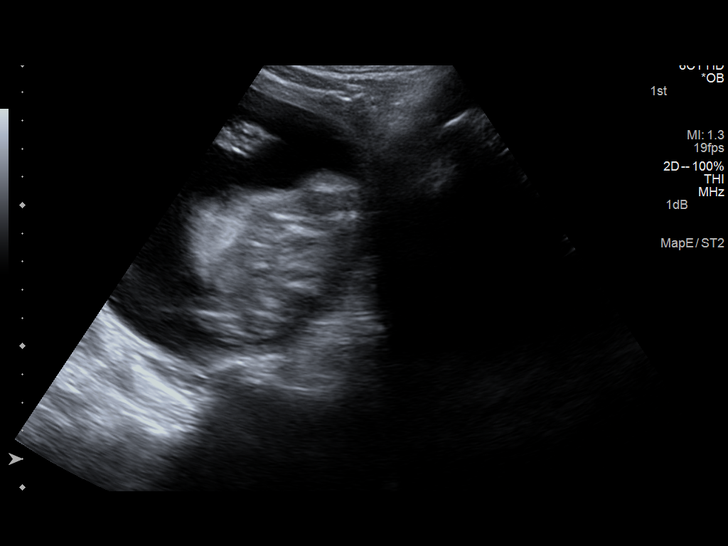
[im 22/29]
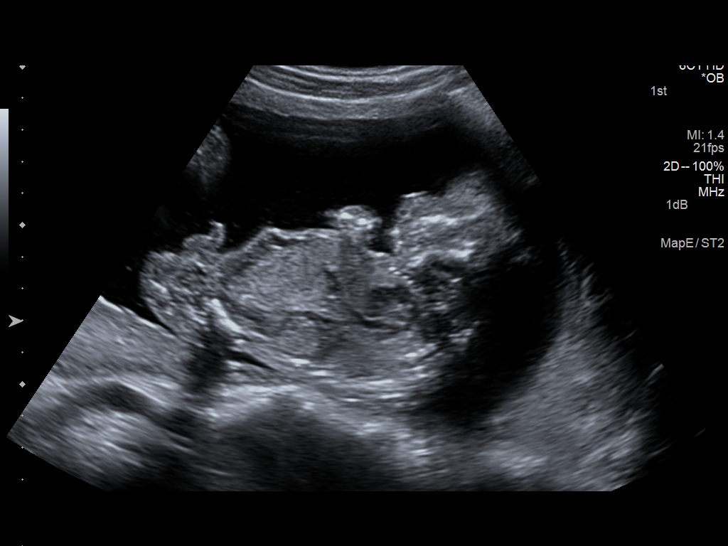
[im 24/29]
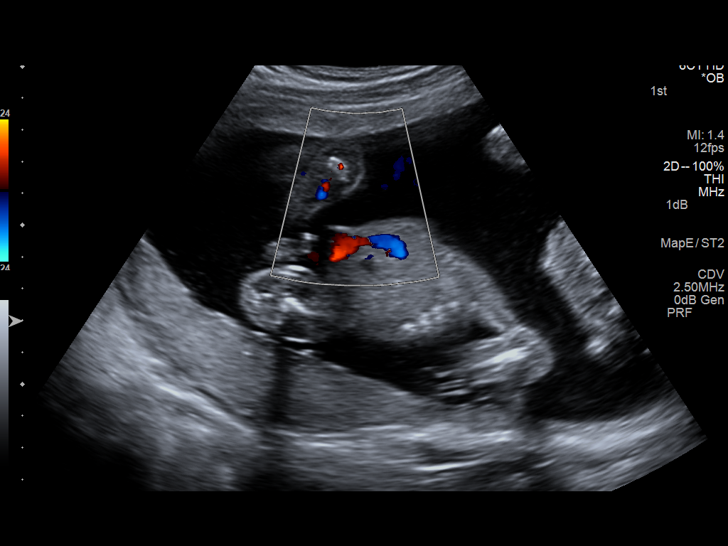
[im 26/29]
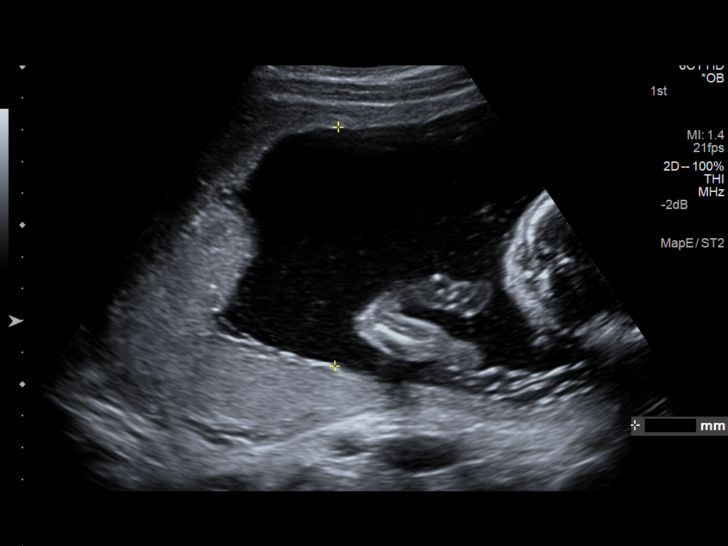
[im 29/29]
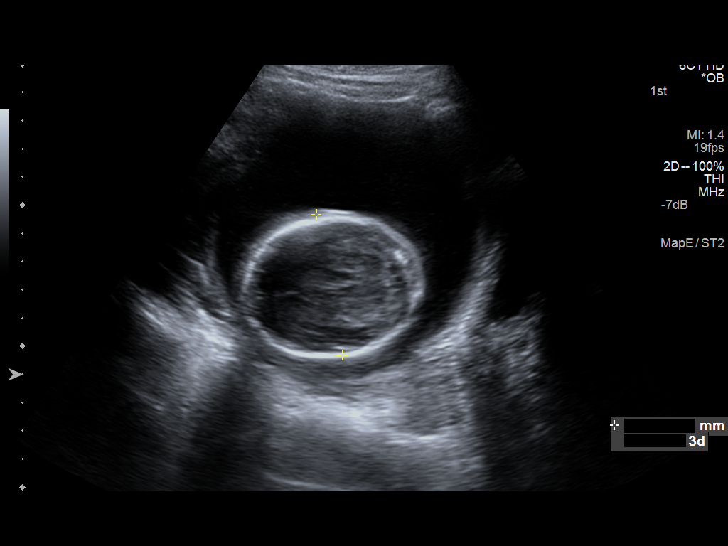

[14 of 28 positions shown; findings below may reference images not displayed]

FINDINGS: Number of Fetuses: 1

Heart Rate:  130 bpm

Movement: Yes

Presentation: Cephalic

Placental Location: Fundal

Previa: No

Amniotic Fluid (Subjective):  Within normal limits.

BPD:  5.0cm 21w  2d

MATERNAL FINDINGS:

Cervix:  Appears closed.

Uterus/Adnexae:  No abnormality visualized.
IMPRESSION: Single living intrauterine fetus in cephalic presentation. No acute
maternal findings visualized.

This exam is performed on an emergent basis and does not
comprehensively evaluate fetal size, dating, or anatomy; follow-up
complete OB US should be considered if further fetal assessment is
warranted.
